# Patient Record
Sex: Male | Born: 1985 | Race: White | Hispanic: No | Marital: Single | Smoking: Never smoker
Health system: Southern US, Community
[De-identification: ages and names within clinical notes are randomized; demographics above are authoritative.]

## PROBLEM LIST (undated history)

## (undated) HISTORY — PX: FRACTURE SURGERY: SHX138

## (undated) HISTORY — PX: APPENDECTOMY: SHX54

## (undated) HISTORY — PX: ANKLE SURGERY: SHX546

## (undated) HISTORY — PX: SCAPHOID FRACTURE SURGERY: SHX769

## (undated) HISTORY — PX: CHOLECYSTECTOMY: SHX55

---

## 2016-03-14 ENCOUNTER — Emergency Department (HOSPITAL_COMMUNITY)
Admission: EM | Admit: 2016-03-14 | Discharge: 2016-03-14 | Disposition: A | Discharge status: Home / Self Care | Payer: Managed Care, Other (non HMO) | Attending: Emergency Medicine | Admitting: Emergency Medicine

## 2016-03-14 ENCOUNTER — Encounter (HOSPITAL_COMMUNITY): Payer: Self-pay | Admitting: Emergency Medicine

## 2016-03-14 ENCOUNTER — Emergency Department (HOSPITAL_COMMUNITY): Payer: Managed Care, Other (non HMO)

## 2016-03-14 DIAGNOSIS — S0083XA Contusion of other part of head, initial encounter: Secondary | ICD-10-CM | POA: Insufficient documentation

## 2016-03-14 DIAGNOSIS — Y999 Unspecified external cause status: Secondary | ICD-10-CM | POA: Diagnosis not present

## 2016-03-14 DIAGNOSIS — Y92410 Unspecified street and highway as the place of occurrence of the external cause: Secondary | ICD-10-CM | POA: Diagnosis not present

## 2016-03-14 DIAGNOSIS — R51 Headache: Secondary | ICD-10-CM | POA: Diagnosis not present

## 2016-03-14 DIAGNOSIS — Y9389 Activity, other specified: Secondary | ICD-10-CM | POA: Diagnosis not present

## 2016-03-14 DIAGNOSIS — R6884 Jaw pain: Secondary | ICD-10-CM | POA: Diagnosis present

## 2016-03-14 MED ORDER — NAPROXEN 500 MG PO TABS: 500.0000 mg | ORAL_TABLET | Freq: Two times a day (BID) | ORAL | 0 refills | Status: DC

## 2016-03-14 MED ORDER — HYDROCODONE-ACETAMINOPHEN 5-325 MG PO TABS: 1.0000 | ORAL_TABLET | Freq: Four times a day (QID) | ORAL | 0 refills | Status: AC | PRN

## 2016-03-14 MED ORDER — OXYCODONE-ACETAMINOPHEN 5-325 MG PO TABS
1.0000 | ORAL_TABLET | Freq: Once | ORAL | Status: AC
  Administered 2016-03-14: 1 via ORAL
  Filled 2016-03-14: qty 1

## 2016-03-14 MED ORDER — NAPROXEN 500 MG PO TABS
500.0000 mg | ORAL_TABLET | Freq: Once | ORAL | Status: AC
  Administered 2016-03-14: 500 mg via ORAL
  Filled 2016-03-14: qty 1
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
## 2016-03-14 NOTE — Discharge Instructions (Signed)
Eat soft or liquid foods such as soups, mashed potatoes, etc that require little chewing until pain improves.  Take naproxen twice daily for the next 3 days, then as needed for pain. Use pain medication only for severe pain - This can make you very drowsy - please do not drink alcohol, operate heavy machinery or drive on this medication. Apply ice to affected area for additional pain relief. Follow up with your primary care physician if no improvement in 1 week. Return to ER for new or worsening symptoms, any additional concerns.

## 2016-03-14 NOTE — ED Notes (Signed)
PA at the bedside.

## 2017-01-04 ENCOUNTER — Encounter (HOSPITAL_COMMUNITY): Payer: Self-pay | Admitting: Emergency Medicine

## 2017-01-04 ENCOUNTER — Emergency Department (HOSPITAL_COMMUNITY)
Admission: EM | Admit: 2017-01-04 | Discharge: 2017-01-04 | Disposition: A | Discharge status: Home / Self Care | Payer: No Typology Code available for payment source | Attending: Emergency Medicine | Admitting: Emergency Medicine

## 2017-01-04 ENCOUNTER — Emergency Department (HOSPITAL_COMMUNITY): Payer: No Typology Code available for payment source

## 2017-01-04 DIAGNOSIS — Y92410 Unspecified street and highway as the place of occurrence of the external cause: Secondary | ICD-10-CM | POA: Insufficient documentation

## 2017-01-04 DIAGNOSIS — S20212A Contusion of left front wall of thorax, initial encounter: Secondary | ICD-10-CM | POA: Diagnosis not present

## 2017-01-04 DIAGNOSIS — Y999 Unspecified external cause status: Secondary | ICD-10-CM | POA: Insufficient documentation

## 2017-01-04 DIAGNOSIS — S299XXA Unspecified injury of thorax, initial encounter: Secondary | ICD-10-CM | POA: Diagnosis present

## 2017-01-04 DIAGNOSIS — Y939 Activity, unspecified: Secondary | ICD-10-CM | POA: Diagnosis not present

## 2017-01-04 MED ORDER — NAPROXEN 500 MG PO TABS: 500.0000 mg | ORAL_TABLET | Freq: Two times a day (BID) | ORAL | 0 refills | Status: AC

## 2017-01-04 NOTE — ED Notes (Signed)
Patient transported to X-ray 
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
## 2017-01-04 NOTE — ED Notes (Signed)
ED Provider at bedside. 
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
